# Patient Record
Sex: Male | Born: 1965 | Race: Black or African American | Hispanic: No
Health system: Southern US, Community
[De-identification: ages and names within clinical notes are randomized; demographics above are authoritative.]

---

## 1997-08-06 ENCOUNTER — Emergency Department (HOSPITAL_COMMUNITY): Admission: EM | Admit: 1997-08-06 | Discharge: 1997-08-06 | Payer: Self-pay | Admitting: Emergency Medicine

## 2006-06-12 ENCOUNTER — Ambulatory Visit (HOSPITAL_COMMUNITY): Admission: RE | Admit: 2006-06-12 | Discharge: 2006-06-12 | Payer: Self-pay | Admitting: Cardiology

## 2009-01-02 ENCOUNTER — Encounter: Admission: RE | Admit: 2009-01-02 | Discharge: 2009-01-02 | Payer: Self-pay | Admitting: Cardiology

## 2010-08-02 ENCOUNTER — Emergency Department (HOSPITAL_COMMUNITY)
Admission: EM | Admit: 2010-08-02 | Discharge: 2010-08-02 | Disposition: A | Discharge status: Home / Self Care | Payer: Self-pay | Attending: Emergency Medicine | Admitting: Emergency Medicine

## 2010-08-02 ENCOUNTER — Emergency Department (HOSPITAL_COMMUNITY): Payer: Self-pay

## 2010-08-02 DIAGNOSIS — R55 Syncope and collapse: Secondary | ICD-10-CM | POA: Insufficient documentation

## 2010-08-02 DIAGNOSIS — E860 Dehydration: Secondary | ICD-10-CM | POA: Insufficient documentation

## 2010-08-02 DIAGNOSIS — R42 Dizziness and giddiness: Secondary | ICD-10-CM | POA: Insufficient documentation

## 2010-08-02 DIAGNOSIS — R232 Flushing: Secondary | ICD-10-CM | POA: Insufficient documentation

## 2010-08-02 LAB — POCT I-STAT, CHEM 8
BUN: 22 mg/dL (ref 6–23)
Chloride: 106 mEq/L (ref 96–112)
Glucose, Bld: 87 mg/dL (ref 70–99)
HCT: 43 % (ref 39.0–52.0)
Hemoglobin: 14.6 g/dL (ref 13.0–17.0)
TCO2: 24 mmol/L (ref 0–100)

## 2011-09-27 IMAGING — CR DG CHEST 2V
2 series · 2 of 2 positions shown · non-contrast
Comparison: 01/02/2009

CLINICAL DATA: Syncope

CHEST - 2 VIEW

[w chest pa]
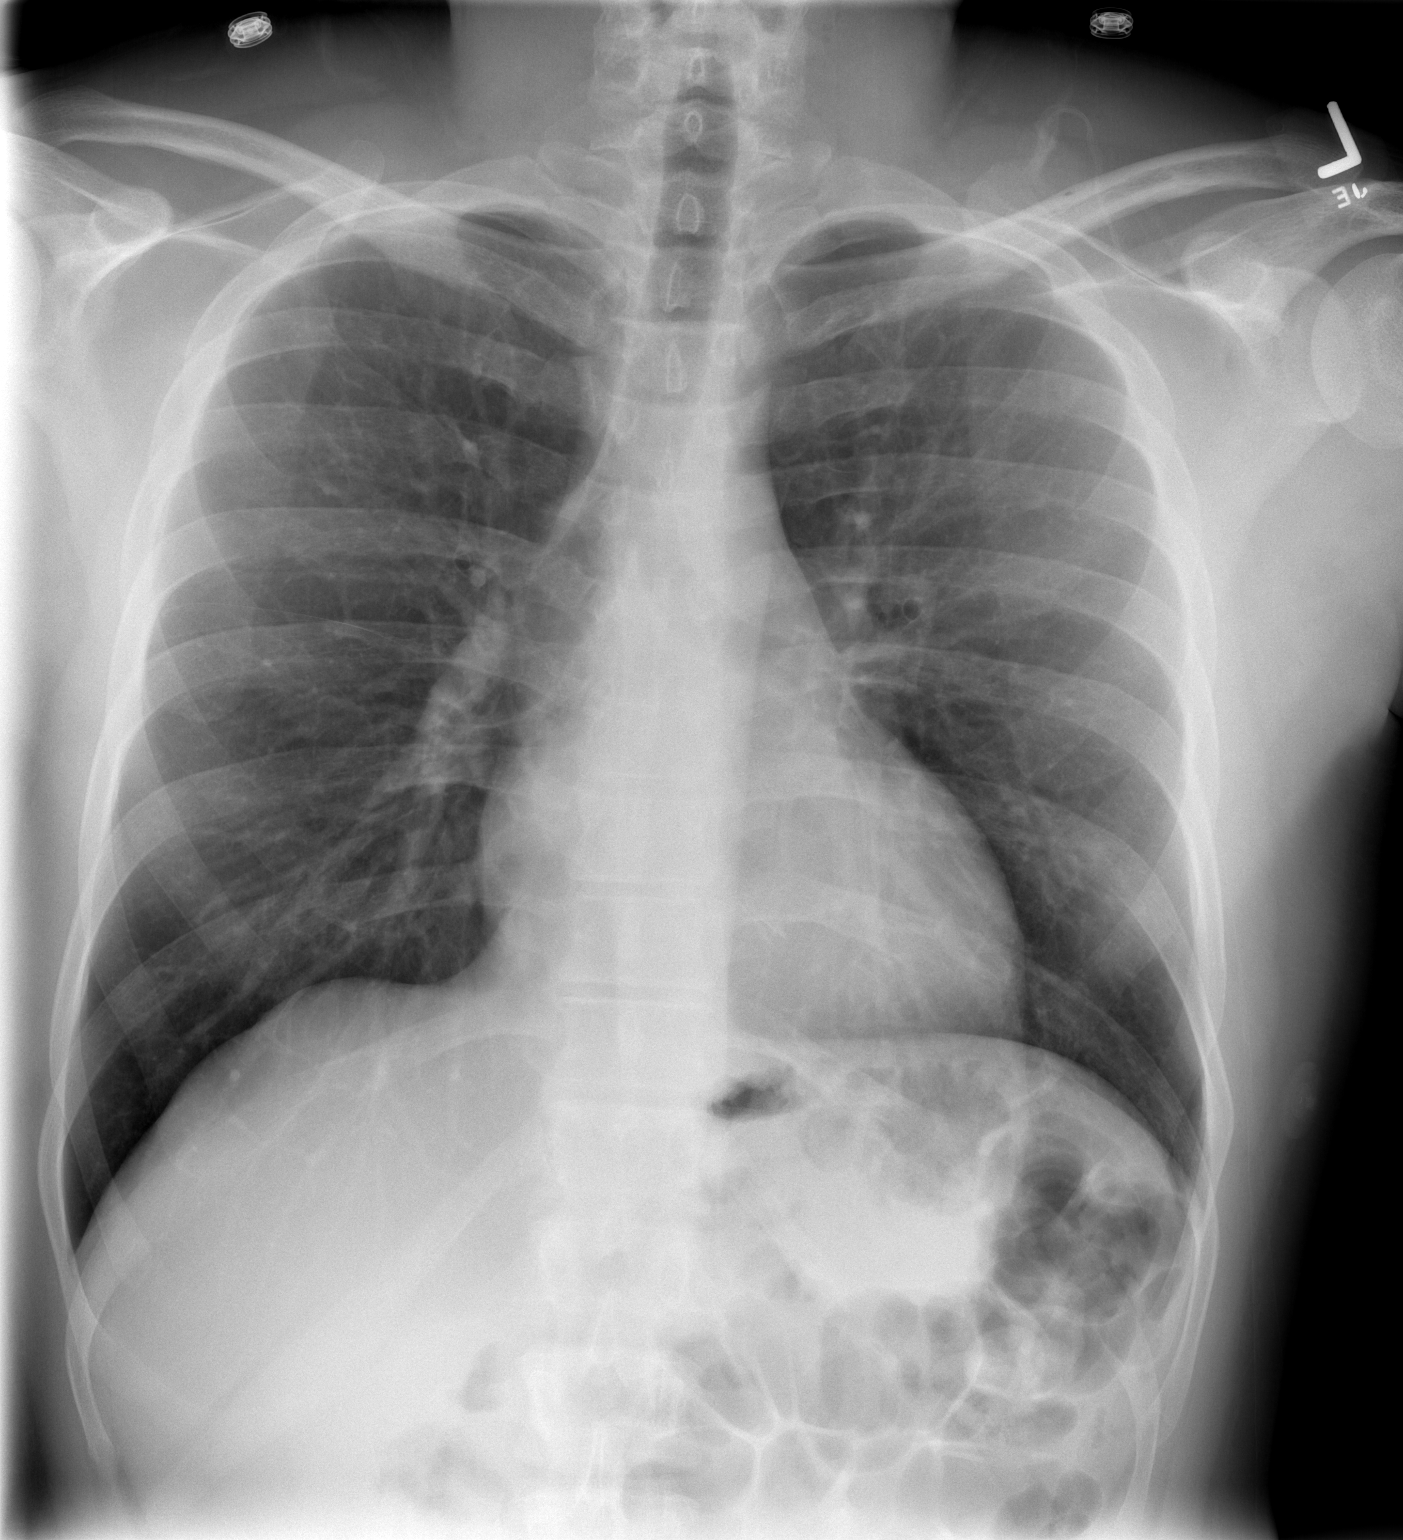

[w chest lat]
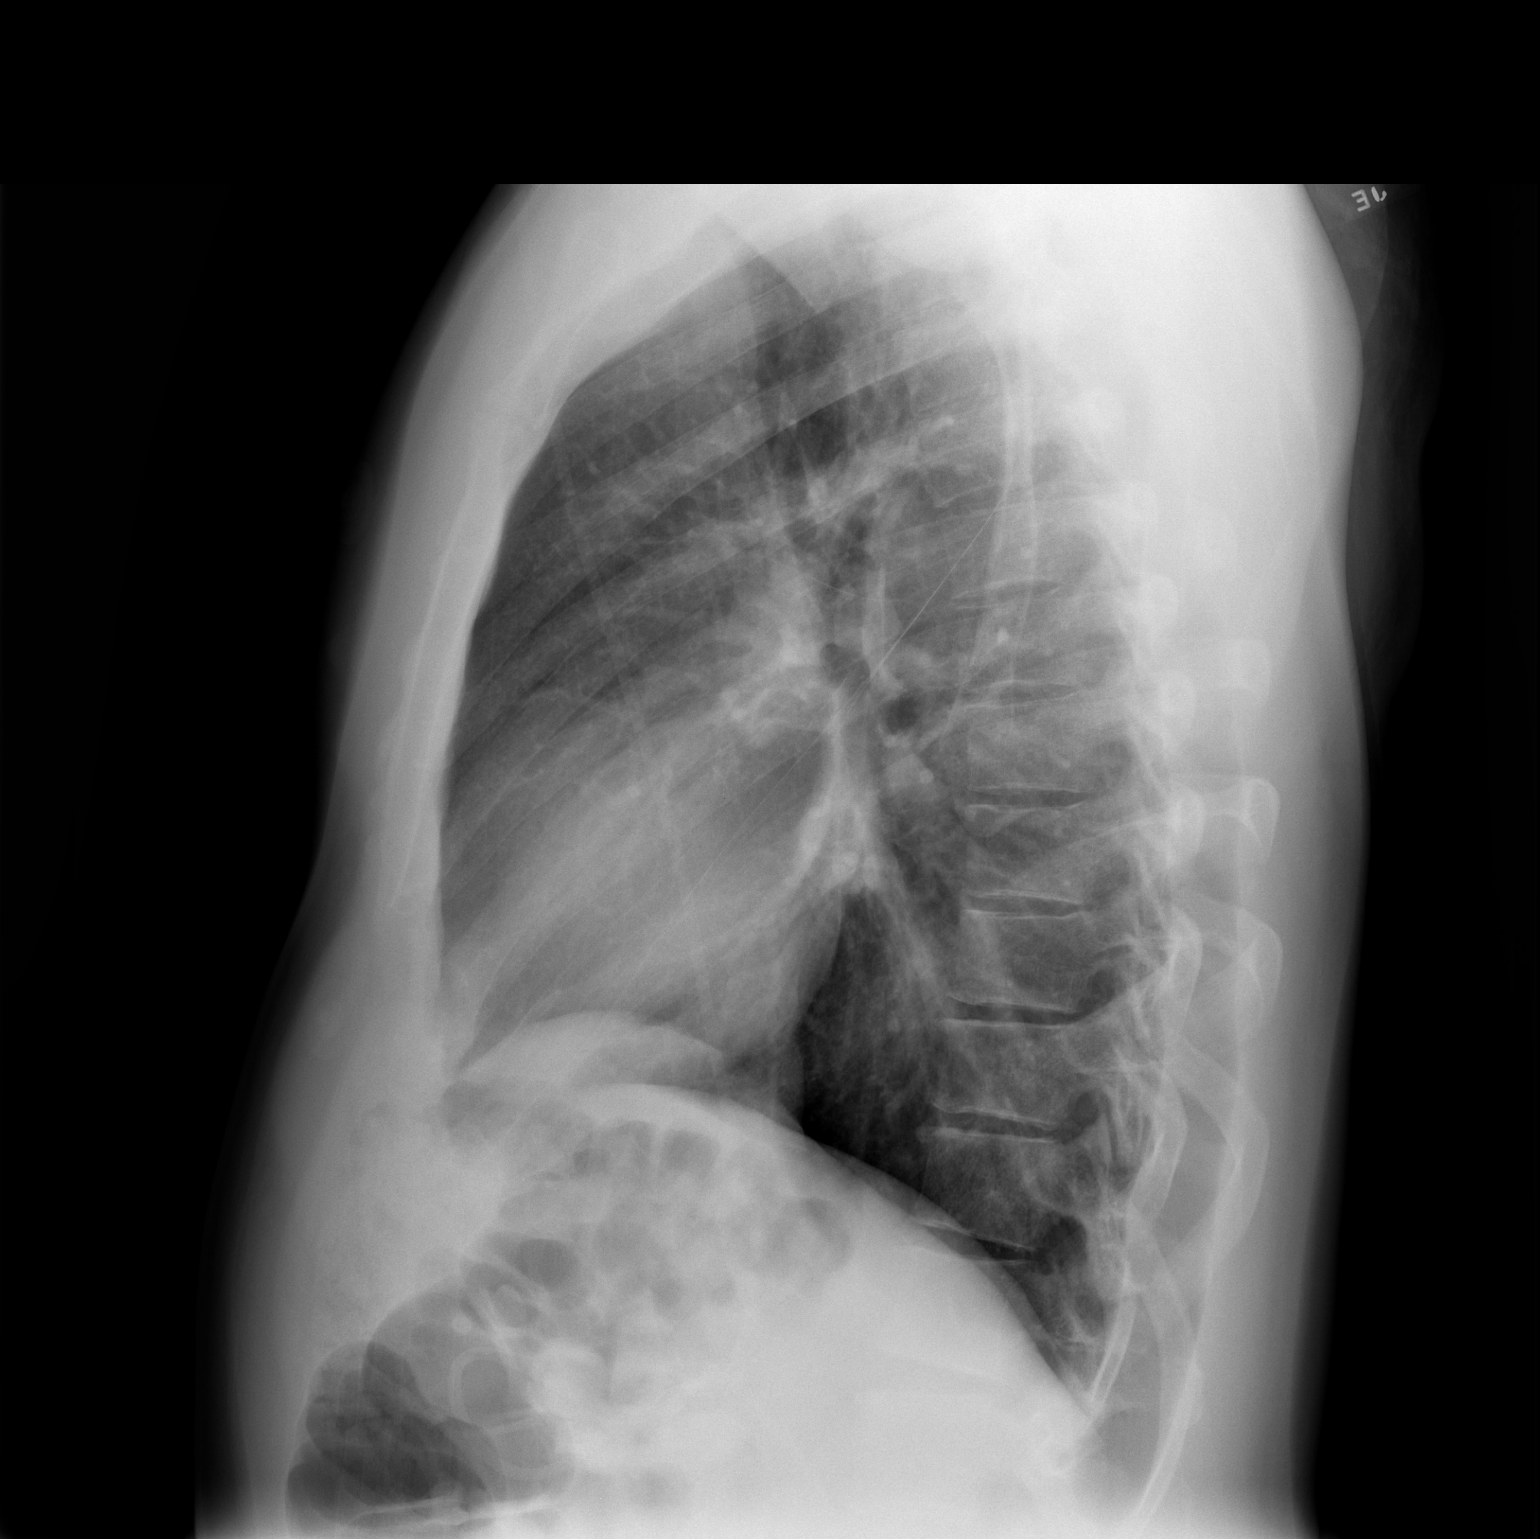

[2 of 2 positions shown; findings below may reference images not displayed]

FINDINGS: The lungs are clear without focal infiltrate, edema,
pneumothorax or pleural effusion. The cardiopericardial silhouette
is within normal limits for size. Imaged bony structures of the
thorax are intact.
IMPRESSION: No acute cardiopulmonary process.

## 2018-12-13 ENCOUNTER — Other Ambulatory Visit: Payer: Self-pay

## 2018-12-13 ENCOUNTER — Other Ambulatory Visit: Payer: Self-pay | Admitting: *Deleted

## 2018-12-13 DIAGNOSIS — Z125 Encounter for screening for malignant neoplasm of prostate: Secondary | ICD-10-CM

## 2018-12-13 NOTE — Progress Notes (Addendum)
Patient: Edward [name]           Date of Birth: 1965/12/03           MRN: [id number] Visit Date: 12/13/2018 PCP: No primary care provider on file.  Prostate Cancer Screening Date of last physical exam: (virtual in Feb/March 2020) Date of last rectal exam: (not sure) Have you ever had or been told you have an allergy to latex products?: No Are you currently taking any natural prostate preparations?: No Are you currently experiencing any urinary symptoms?: No  Temperature 97.1   Prostate Exam Exam not completed.  Patient's History There are no active problems to display for this patient.  No past medical history on file.  No family history on file.  Social History   Occupational History  . Not on file  Tobacco Use  . Smoking status: Not on file  Substance and Sexual Activity  . Alcohol use: Not on file  . Drug use: Not on file  . Sexual activity: Not on file

## 2018-12-14 LAB — PSA: Prostate Specific Ag, Serum: 1.8 ng/mL (ref 0.0–4.0)

## 2020-10-15 ENCOUNTER — Other Ambulatory Visit: Payer: Self-pay

## 2020-10-15 ENCOUNTER — Encounter: Payer: 59 | Attending: Internal Medicine | Admitting: Dietician

## 2020-10-15 DIAGNOSIS — R7303 Prediabetes: Secondary | ICD-10-CM | POA: Insufficient documentation

## 2020-10-15 NOTE — Progress Notes (Signed)
On 10/15/2020 patient completed Core Session 1 of Diabetes Prevention Program course virtually with [facility]. The following learning objectives were met by the patient during this class:   Virtual Visit via Video Note  I connected with Edward [name]. [place], August 29, 1965 by a video enabled application and verified that I am speaking with the correct person using two identifiers.  Location: Patient: Virtual Provider: Office    Learning Objectives:  Be able to explain the purpose and benefits of the National Diabetes Prevention Program.  Be able to describe the events that will take place at every session.  Know the weight loss and physical activity goals established by the [facility] Diabetes Prevention Program.  Know their own individual weight loss and physical activity goals.  Be able to explain the important effect of self-monitoring on behavior change.   Goals:  Record food and beverage intake in "Food and Activity Tracker" over the next week.  E-mail completed "Food and Activity Tracker" to Lifestyle Coach next week before session 2. Circle the foods or beverages you think are highest in fat and calories in your food tracker. Read the labels on the food you buy, and consider using measuring cups and spoons to help you calculate the amount you eat. We will talk about measuring in more detail in the coming weeks.   Follow-Up Plan: Attend Core Session 2 next week.  E-mail completed "Food and Activity Tracker" to Lifestyle Coach next week before class.

## 2020-10-22 ENCOUNTER — Other Ambulatory Visit: Payer: Self-pay

## 2020-10-22 ENCOUNTER — Encounter: Payer: 59 | Attending: Internal Medicine | Admitting: Dietician

## 2020-10-22 DIAGNOSIS — R7303 Prediabetes: Secondary | ICD-10-CM | POA: Insufficient documentation

## 2020-10-22 NOTE — Progress Notes (Signed)
On 10/22/2020 patient completed Core Session 2 of Diabetes Prevention Program course virtually with [facility]. The following learning objectives were met by the patient during this class:   Virtual Visit via Video Note  I connected with Edward [name], 11/09/1965 on 10/22/20 at  9:00 AM EDT by a video enabled application and verified that I am speaking with the correct person using two identifiers.  Location: Patient: Virtual Provider: Office  Learning Objectives: Self-monitor their weight during the weeks following Session 2.  Describe the relationship between fat and calories.  Explain the reason for, and basic principles of, self-monitoring fat grams and calories.  Identify their personal fat gram goals.  Use the ?Fat and Calorie Counter to calculate the calories and fat grams of a given selection of foods.  Keep a running total of the fat grams they eat each day.  Calculate fat, calories, and serving sizes from nutrition labels.   Goals:  Weigh yourself at the same time each day, or every few days, and record your weight in your Food and Activity Tracker. Write down everything you eat and drink in your Food and Activity Tracker. Measure portions as much as you can, and start reading labels.  Use the ?Fat and Calorie Counter to figure out the amount of fat and calories in what you ate, and write the amount down in your Food and Activity Tracker. Keep a running fat gram total throughout the day. Come as close to your fat gram goal as you can.   Follow-Up Plan: Attend Core Session 3 next week.  Email completed  "Food and Activity Tracker" to Lifestyle Coach next week.   

## 2020-10-29 ENCOUNTER — Other Ambulatory Visit: Payer: Self-pay

## 2020-10-29 ENCOUNTER — Encounter: Payer: 59 | Admitting: Dietician

## 2020-10-29 DIAGNOSIS — R7303 Prediabetes: Secondary | ICD-10-CM

## 2020-10-29 NOTE — Progress Notes (Signed)
On 10/29/2020 patient completed Core Session 3 of Diabetes Prevention Program course virtually with [facility]. The following learning objectives were met by the patient during this class:   Virtual Visit via Video Note  I connected with Meriam Sprague. [place], September 07, 1965 on 10/29/20 at  9:00 AM EDT by a video enabled application and verified that I am speaking with the correct person using two identifiers.  Location: Patient: Virtual Provider: Office  Learning Objectives: Weigh and measure foods. Estimate the fat and calorie content of common foods. Describe three ways to eat less fat and fewer calories. Create a plan to eat less fat for the following week.   Goals:  Track weight when weighing outside of class.  Track food and beverages eaten each day in Food and Activity Tracker and include fat grams and calories for each.  Try to stay within fat gram goal.  Complete plan for eating less high fat foods and answer related homework questions.    Follow-Up Plan: Attend Core Session 4 next week.  Bring completed "Food and Activity Tracker" next week to be reviewed by Lifestyle Coach.

## 2020-11-12 ENCOUNTER — Ambulatory Visit: Payer: 59 | Admitting: Registered"

## 2020-11-12 ENCOUNTER — Encounter: Payer: Self-pay | Admitting: Registered"

## 2020-11-12 DIAGNOSIS — R7303 Prediabetes: Secondary | ICD-10-CM

## 2020-11-12 NOTE — Progress Notes (Signed)
On 11/12/20 patient completed Core Session 5 of Diabetes Prevention Program course virtually with [facility]. The following learning objectives were met by the patient during this class:   Virtual Visit via Video Note  I connected with Edward [name] on 11/12/20 at  9:00 AM EDT by a video enabled application and verified that I am speaking with the correct person using two identifiers.  Location: Patient: Home.  Provider: Office.   Learning Objectives: Establish a physical activity goal. Explain the importance of the physical activity goal. Describe their current level of physical activity. Name ways that they are already physically active. Develop personal plans for physical activity for the next week.   Goals:  Record weight taken outside of class.  Track foods and beverages eaten each day in the "Food and Activity Tracker," including calories and fat grams for each item.  Make an Activity Plan including date, specific type of activity, and length of time you plan to be active that includes at last 60 minutes of activity for the week.  Track activity type, minutes you were active, and distance you reached each day in the "Food and Activity Tracker."   Follow-Up Plan: Attend Core Session 6 next week.  E-mail completed "Food and Activity Tracker" to Lifestyle Coach next week before class

## 2020-11-19 ENCOUNTER — Encounter: Payer: 59 | Admitting: Dietician

## 2020-11-19 ENCOUNTER — Other Ambulatory Visit: Payer: Self-pay

## 2020-11-19 DIAGNOSIS — R7303 Prediabetes: Secondary | ICD-10-CM

## 2020-11-19 NOTE — Progress Notes (Signed)
On 11/19/2020 patient completed Core Session 6 of Diabetes Prevention Program course virtually with [facility]. The following learning objectives were met by the patient during this class:   Virtual Visit via Video Note  I connected with Edward [name]. Edward [name], 10/21/1965 on 11/19/20 at  9:00 AM EDT by a video enabled application and verified that I am speaking with the correct person using two identifiers.  Location: Patient: Virtual Provider: Office  Learning Objectives: Graph their daily physical activity.  Describe two ways of finding the time to be active.  Define "lifestyle activity."  Describe how to prevent injury.  Develop an activity plan for the coming week.   Goals:  Record weight taken outside of class.  Track foods and beverages eaten each day in the "Food and Activity Tracker," including calories and fat grams for each item.   Track activity type, minutes you were active, and distance you reached each day in the "Food and Activity Tracker."  Set aside one 20 to 30-minute block of time every day or find two or more periods of 10 to15 minutes each for physical activity.  Warm up, cool down, and stretch. Make a Physical Activities Plan for the Week.   Follow-Up Plan: Attend Core Session 7 next week.  E-mail completed "Food and Activity Tracker" to Lifestyle Coach next week before class

## 2020-12-03 ENCOUNTER — Encounter: Payer: 59 | Attending: Internal Medicine | Admitting: Dietician

## 2020-12-03 ENCOUNTER — Other Ambulatory Visit: Payer: Self-pay

## 2020-12-03 DIAGNOSIS — R7303 Prediabetes: Secondary | ICD-10-CM | POA: Insufficient documentation

## 2020-12-03 NOTE — Progress Notes (Signed)
On 12/03/2020 patient completed Core Session 7 of Diabetes Prevention Program course virtually with [facility]. The following learning objectives were met by the patient during this class:   Virtual Visit via Video Note  I connected with Edward [name]. [name], 04-27-1965 on 12/03/20 at  9:00 AM EDT by a video enabled application and verified that I am speaking with the correct person using two identifiers.  Location: Patient: Virtual Provider: Office  Learning Objectives: Define calorie balance. Explain how healthy eating and being active are related in terms of calorie balance.  Describe the relationship between calorie balance and weight loss.  Describe his or her progress as it relates to calorie balance.  Develop an activity plan for the coming week.   Goals:  Record weight taken outside of class.  Track foods and beverages eaten each day in the "Food and Activity Tracker," including calories and fat grams for each item.   Track activity type, minutes you were active, and distance you reached each day in the "Food and Activity Tracker."  Set aside one 20 to 30-minute block of time every day or find two or more periods of 10 to15 minutes each for physical activity.  Make a Physical Activities Plan for the Week.  Make active lifestyle choices all through the day  Stay at or go slightly over activity goal.   Follow-Up Plan: Attend Core Session 8 next week.  E-mail completed "Food and Activity Tracker" to Lifestyle Coach next week before class

## 2020-12-10 ENCOUNTER — Encounter: Payer: 59 | Admitting: Dietician

## 2020-12-10 ENCOUNTER — Other Ambulatory Visit: Payer: Self-pay

## 2020-12-10 DIAGNOSIS — R7303 Prediabetes: Secondary | ICD-10-CM

## 2020-12-10 NOTE — Progress Notes (Signed)
On 12/10/2020 patient completed Core Session 8 of Diabetes Prevention Program course virtually with [facility]. The following learning objectives were met by the patient during this class:   Virtual Visit via Video Note  I connected with Edward [name], 19-Sep-1965 by a video enabled application and verified that I am speaking with the correct person using two identifiers.  Location: Patient: Virtual Provider: Office  Learning Objectives: Recognize positive and negative food and activity cues.  Change negative food and activity cues to positive cues.  Add positive cues for activity and eliminate cues for inactivity.  Develop a plan for removing one problem food cue for the coming week.   Goals:  Record weight taken outside of class.  Track foods and beverages eaten each day in the "Food and Activity Tracker," including calories and fat grams for each item.   Track activity type, minutes you were active, and distance you reached each day in the "Food and Activity Tracker."  Set aside one 20 to 30-minute block of time every day or find two or more periods of 10 to15 minutes each for physical activity.  Remove one problem food cue.  Add one positive cue for being more active.  Follow-Up Plan: Attend Core Session 9 next week.  Email completed "Food and Activity Tracker" next week to be reviewed by Lifestyle Coach.

## 2020-12-24 ENCOUNTER — Encounter: Payer: 59 | Attending: Internal Medicine

## 2020-12-24 DIAGNOSIS — R7303 Prediabetes: Secondary | ICD-10-CM | POA: Insufficient documentation

## 2021-01-07 ENCOUNTER — Encounter: Payer: 59 | Admitting: Dietician

## 2021-01-07 ENCOUNTER — Other Ambulatory Visit: Payer: Self-pay

## 2021-01-07 DIAGNOSIS — R7303 Prediabetes: Secondary | ICD-10-CM

## 2021-01-07 NOTE — Progress Notes (Signed)
On 01/07/2021 patient completed Session 11 of Diabetes Prevention Program course virtually with [facility]. By the end of this session patients are able to complete the following objectives:   Virtual Visit via Video Note  I connected with Edward [name]. [place][birth year] by a video enabled application and verified that I am speaking with the correct person using two identifiers.  Location: Patient: Virtual Provider: Office  Learning Objectives: Give examples of negative thoughts that could prevent them from meeting their goals of losing weight and being more physically active.  Describe how to stop negative thoughts and talk back to them with positive thoughts.  Practice 1) stopping negative thoughts and 2) talking back to negative thoughts with positive ones.    Goals:  Record weight taken outside of class.  Track foods and beverages eaten each day in the "Food and Activity Tracker," including calories and fat grams for each item.   Track activity type, minutes you were active, and distance you reached each day in the "Food and Activity Tracker."  If you have any negative thoughts-write them in your Food and Activity Trackers, along with how you talked back to them. Practice stopping negative thoughts and talking back to them with positive thoughts.   Follow-Up Plan: Attend Core Session 12 next week.  Email completed "Food and Activity Tracker" before next week to be reviewed by Lifestyle Coach.

## 2021-01-21 ENCOUNTER — Other Ambulatory Visit: Payer: Self-pay

## 2021-01-21 ENCOUNTER — Encounter: Payer: 59 | Admitting: Dietician

## 2021-01-21 DIAGNOSIS — R7303 Prediabetes: Secondary | ICD-10-CM

## 2021-01-21 NOTE — Progress Notes (Signed)
Patient was seen on 01/21/2021 for the Core Session 12 of Diabetes Prevention Program course at [facility]. By the end of this session patients are able to complete the following objectives:   Virtual Visit via Video Note  I connected with Edward [name], Aug 01, 1965 on 01/21/21 at  9:00 AM EDT by a video enabled application and verified that I am speaking with the correct person using two identifiers.  Location: Patient: Virtual Provider: Office  Learning Objectives: Describe their current progress toward defined goals. Describe common causes for slipping from healthy eating or being active. Explain what to do to get back on their feet after a slip.  Goals:  Record weight taken outside of class.  Track foods and beverages eaten each day in the "Food and Activity Tracker," including calories and fat grams for each item.   Track activity type, minutes active, and distance reached each day in the "Food and Activity Tracker."  Try out the two action plans created during session- "Slips from Healthy Eating: Action Plan" and "Slips from Being Active: Action Plan" Answer questions on the handout.   Follow-Up Plan: Attend Core Session 13 next week.  Bring completed "Food and Activity Tracker" next week to be reviewed by Lifestyle Coach.

## 2021-01-28 ENCOUNTER — Encounter: Payer: 59 | Attending: Internal Medicine | Admitting: Dietician

## 2021-01-28 ENCOUNTER — Other Ambulatory Visit: Payer: Self-pay

## 2021-01-28 DIAGNOSIS — R7303 Prediabetes: Secondary | ICD-10-CM | POA: Insufficient documentation

## 2021-01-28 NOTE — Progress Notes (Signed)
On 01/28/2021 patient completed the Core Session 13 of Diabetes Prevention Program course virtually with [facility]. By the end of this session patients are able to complete the following objectives:   Virtual Visit via Video Note  I connected with Edward [name]. [place], [birth year] on 01/28/21 at  9:00 AM EST by a video enabled application and verified that I am speaking with the correct person using two identifiers.  Location: Patient: Virtual Provider: Office  Learning Objectives: Describe ways to add interest and variety to their activity plans. Define ?aerobic fitness. Explain the four F.I.T.T. principles (frequency, intensity, time, and type of activity) and how they relate to aerobic fitness.   Goals:  Record weight taken outside of class.  Track foods and beverages eaten each day in the "Food and Activity Tracker," including calories and fat grams for each item.   Track activity type, minutes you were active, and distance you reached each day in the "Food and Activity Tracker."  Do your best to reach activity goal for the week. Use one of the F.I.T.T. principles to jump start workouts. Document activity level on the "To Do Next Week" handout.  Follow-Up Plan: Attend Core Session 14 next week.  Email completed "Food and Activity Tracker" before next week to be reviewed by Lifestyle Coach.

## 2021-02-11 ENCOUNTER — Encounter: Payer: 59 | Admitting: Dietician

## 2021-02-11 ENCOUNTER — Other Ambulatory Visit: Payer: Self-pay

## 2021-02-11 DIAGNOSIS — R7303 Prediabetes: Secondary | ICD-10-CM

## 2021-02-11 NOTE — Progress Notes (Signed)
On 02/11/2021 patient completed Core Session 14 of Diabetes Prevention Program course virtually with [facility]. By the end of this session patients are able to complete the following objectives:   Virtual Visit via Video Note  I connected with Edward Beaver. [name], February 23, 1966 on 02/11/21 at  9:00 AM EST by a video enabled application and verified that I am speaking with the correct person using two identifiers.  Location: Patient: Virtual Provider: Office  Learning Objectives: Give examples of problem social cues and helpful social cues.  Explain how to remove problem social cues and add helpful ones.  Describe ways of coping with vacations and social events such as parties, holidays, and visits from relatives and friends.  Create an action plan to change a problem social cue and add a helpful one.   Goals:  Record weight taken outside of class.  Track foods and beverages eaten each day in the "Food and Activity Tracker," including calories and fat grams for each item.   Track activity type, minutes you were active, and distance you reached each day in the "Food and Activity Tracker."  Do your best to reach activity goal for the week. Use action plan created during session to change a problem social cue and add a helpful social cue.  Answer questions regarding success of changing social cues on "To Do Next Week" handout.   Follow-Up Plan: Attend Core Session 15 next week.  Email completed "Food and Activity Tracker" before next week to be reviewed by Lifestyle Coach.

## 2021-02-18 ENCOUNTER — Encounter: Payer: 59 | Admitting: Dietician

## 2021-02-18 ENCOUNTER — Other Ambulatory Visit: Payer: Self-pay

## 2021-02-18 DIAGNOSIS — R7303 Prediabetes: Secondary | ICD-10-CM

## 2021-02-18 NOTE — Progress Notes (Signed)
On 02/18/2021 patient completed Core Session 15 of Diabetes Prevention Program course virtually with [facility]. By the end of this session patients are able to complete the following objectives:   Virtual Visit via Video Note  I connected with Edward [name]. [place], 12-30-1965 on 02/18/21 at  9:00 AM EST by a video enabled application and verified that I am speaking with the correct person using two identifiers.  Location: Patient: Virtual Provider: Office  Learning Objectives: Explain how to prevent stress or cope with unavoidable stress.  Describe how this program can be a source of stress.  Explain how to manage stressful situations.  Create and follow an action plan for either preventing or coping with a stressful situation.   Goals:  Record weight taken outside of class.  Track foods and beverages eaten each day in the "Food and Activity Tracker," including calories and fat grams for each item.   Track activity type, minutes you were active, and distance you reached each day in the "Food and Activity Tracker."  Do your best to reach activity goal for the week. Follow your action plan to reduce stress.  Answer questions on handout regarding success of action plan.   Follow-Up Plan: Attend Core Session 16 next week.  Email completed "Food and Activity Tracker" before next week to be reviewed by Lifestyle Coach.

## 2021-03-04 ENCOUNTER — Other Ambulatory Visit: Payer: Self-pay

## 2021-03-04 ENCOUNTER — Encounter: Payer: 59 | Attending: Internal Medicine | Admitting: Dietician

## 2021-03-04 DIAGNOSIS — R7303 Prediabetes: Secondary | ICD-10-CM | POA: Insufficient documentation

## 2021-03-04 NOTE — Progress Notes (Signed)
On 03/04/2021 patient completed a post core session of the Diabetes Prevention Program course virtually with [facility]. By the end of this session patients are able to complete the following objectives:   Virtual Visit via Video Note  I connected with Edward [name], 03/15/1966 by a video enabled application and verified that I am speaking with the correct person using two identifiers.  Location: Patient: Virtual Provider: Office  Learning Objectives: Identify how to maintain and/or continue working toward program goals for the remainder of the program.  Describe ways that food and activity tracking can assist them in maintaining/reaching program goals.  Identify progress they have made since the beginning of the program.   Goals:  Record weight taken outside of class.  Track foods and beverages eaten each day in the "Food and Activity Tracker," including calories and fat grams for each item.   Track activity type, minutes you were active, and distance you reached each day in the "Food and Activity Tracker."   Follow-Up Plan: Attend session 18 in two weeks.  Email completed "Food and Activity Trackers" before next session to be reviewed by Lifestyle Coach.

## 2021-04-01 ENCOUNTER — Encounter: Payer: Self-pay | Attending: Internal Medicine

## 2021-04-01 DIAGNOSIS — R7303 Prediabetes: Secondary | ICD-10-CM | POA: Insufficient documentation

## 2021-04-15 ENCOUNTER — Encounter: Payer: Self-pay | Admitting: Dietician

## 2021-04-15 ENCOUNTER — Other Ambulatory Visit: Payer: Self-pay

## 2021-04-15 DIAGNOSIS — R7303 Prediabetes: Secondary | ICD-10-CM

## 2021-04-15 NOTE — Progress Notes (Signed)
On 04/15/2021 patient completed a post core session of the Diabetes Prevention Program course virtually with [facility]. By the end of this session patients are able to complete the following objectives:   Virtual Visit via Video Note  I connected with Edward [name]. [place], 23-May-1965 by a video enabled application and verified that I am speaking with the correct person using two identifiers.  Location: Patient: Virtual Provider: Office  Learning Objectives: Describe how to incorporate more fruits and vegetables into meals. List criteria for selecting good quality fruits and vegetables at the store.  Define mindful eating. List the benefits of eating mindfully.   Goals:  Record weight taken outside of class.  Track foods and beverages eaten each day in the "Food and Activity Tracker," including calories and fat grams for each item.   Track activity type, minutes you were active, and distance you reached each day in the "Food and Activity Tracker."   Follow-Up Plan: Attend next session.  Email completed "Food and Activity Trackers" before next session to be reviewed by Lifestyle Coach.

## 2021-04-29 ENCOUNTER — Encounter: Payer: Self-pay | Attending: Internal Medicine | Admitting: Dietician

## 2021-04-29 ENCOUNTER — Other Ambulatory Visit: Payer: Self-pay

## 2021-04-29 DIAGNOSIS — R7303 Prediabetes: Secondary | ICD-10-CM | POA: Insufficient documentation

## 2021-04-29 NOTE — Progress Notes (Signed)
On 04/29/2021 patient completed the Diabetes Prevention Program course virtually with [facility]. By the end of this session patients are able to complete the following objectives:   Virtual Visit via Video Note  I connected with Edward [name], 10-07-1965 by a video enabled application and verified that I am speaking with the correct person using two identifiers.  Location: Patient: Virtual Provider: Office  Learning Objectives: Define fiber and describe the difference between insoluble and soluble fiber  List foods that are good sources of fiber Explain the health benefits of fiber  Describe ways to increase volume of meals and snacks while staying within fat goal.   Goals:  Record weight taken outside of class.  Track foods and beverages eaten each day in the "Food and Activity Tracker," including calories and fat grams for each item.   Track activity type, minutes you were active, and distance you reached each day in the "Food and Activity Tracker."   Follow-Up Plan: Attend next session.  Email completed "Food and Activity Trackers" before next session to be reviewed by Lifestyle Coach.

## 2021-05-13 ENCOUNTER — Other Ambulatory Visit: Payer: Self-pay

## 2021-05-13 ENCOUNTER — Encounter: Payer: Self-pay | Admitting: Dietician

## 2021-05-13 DIAGNOSIS — R7303 Prediabetes: Secondary | ICD-10-CM

## 2021-05-13 NOTE — Progress Notes (Signed)
On 05/13/2021 patient completed a post core session of the Diabetes Prevention Program course virtually with [facility]. By the end of this session patients are able to complete the following objectives:   Virtual Visit via Video Note  I connected with Edward [name]. Edward [name], 1965/07/08 by a video enabled application and verified that I am speaking with the correct person using two identifiers.  Location: Patient: Virtual Provider: Office  Learning Objectives: List ways to make recipes healthier.  List lower-fat and lower-calorie substitutions for common ingredients.  Identify low-fat cooking methods.  Describe how to choose a cookbook that works best for their needs.   Goals:  Record weight taken outside of class.  Track foods and beverages eaten each day in the "Food and Activity Tracker," including calories and fat grams for each item.   Track activity type, minutes you were active, and distance you reached each day in the "Food and Activity Tracker."   Follow-Up Plan: Attend next session.  Email completed "Food and Activity Trackers" before next session to be reviewed by Lifestyle Coach.

## 2021-06-10 ENCOUNTER — Other Ambulatory Visit: Payer: Self-pay

## 2021-06-10 ENCOUNTER — Encounter: Payer: Self-pay | Attending: Internal Medicine | Admitting: Dietician

## 2021-06-10 DIAGNOSIS — R7303 Prediabetes: Secondary | ICD-10-CM | POA: Insufficient documentation

## 2021-06-10 NOTE — Progress Notes (Signed)
On 06/10/2021 patient attended a virtual grocery store tour session as part of the Diabetes Prevention Program with [facility]. ? ?Virtual Visit via Video Note ? ?I connected with Bayard Beaver. [place], 07/06/1965 by a video enabled application and verified that I am speaking with the correct person using two identifiers. ? ?Location: ?Patient: Virtual ?Provider: Office ? ? Learning Objectives: ?Develop a plan for our grocery shopping experience ?Putting together a list ?How to navigate the grocery store ?Identify 4 main sections of the grocery store ?Produce ?Meat/Poultry/Fish ?Dairy  ?Inside Aisles ?Consider tips for shopping in each of these four sections ?Reflect on our own shopping habits ?Create a new goal for our next grocery shopping experience ?Engage in a group discussion ? ?Goals:  ?Record weight taken outside of class.  ?Track foods and beverages eaten each day in the "Food and Activity Tracker," including calories and fat grams for each item.  ?Create one new goal for the next grocery shopping experience based on the information provided today ? ?Follow-Up Plan: ?Attend next session.  ?Email completed "Food and Activity Tracker" before next session to be reviewed by Lifestyle Coach.  ? ?

## 2021-07-08 ENCOUNTER — Encounter: Payer: Self-pay | Attending: Internal Medicine | Admitting: Dietician

## 2021-07-08 DIAGNOSIS — R7303 Prediabetes: Secondary | ICD-10-CM | POA: Insufficient documentation

## 2021-07-08 NOTE — Progress Notes (Signed)
On 07/08/2021 patient completed a post core session of the Diabetes Prevention Program virtually with [facility]. By the end of this session patients are able to complete the following objectives:  ? ?Virtual Visit via Video Note ? ?I connected with Edward [name]. Edward [name], Edward [name] by a video enabled application and verified that I am speaking with the correct person using two identifiers. ? ?Location: ?Patient: Virtual ?Provider: Office ? ?Learning Objectives: ?List indoor physical activity options.  ?Identify any barriers to being active and brainstorm how to overcome barriers.  ?Describe short and long-term health benefits of physical activity.  ? ?Goals:  ?Record weight taken outside of class.  ?Track foods and beverages eaten each day in the "Food and Activity Tracker," including calories and fat grams for each item.   ?Track activity type, minutes you were active, and distance you reached each day in the "Food and Activity Tracker."  ? ?Follow-Up Plan: ?Attend next session.  ?Email completed "Food and Activity Trackers" before next session to be reviewed by Lifestyle Coach. ? ?

## 2021-08-05 ENCOUNTER — Encounter: Payer: Self-pay | Admitting: Dietician

## 2021-08-05 ENCOUNTER — Encounter: Payer: Self-pay | Attending: Internal Medicine | Admitting: Dietician

## 2021-08-05 DIAGNOSIS — R7303 Prediabetes: Secondary | ICD-10-CM | POA: Insufficient documentation

## 2021-08-05 NOTE — Progress Notes (Signed)
On 08/05/2021 patient completed a post core session of the Diabetes Prevention Program course virtually with [facility]. By the end of this session patients are able to complete the following objectives:  ? ?Virtual Visit via Video Note ? ?I connected with Bayard Beaver. Gaut by a video enabled application and verified that I am speaking with the correct person using two identifiers. ?  ?I discussed the limitations of evaluation and management by telemedicine and the availability of in person appointments. The patient expressed understanding and agreed to proceed. ? ?Location: ?Patient: Edward [name] (virtual) ?Provider: office ? ?Learning Objectives: ?Identify which foods contain carbohydrates.  ?List functions for carbohydrates on the body.  ?Describe the relationship between carbohydrate intake and blood sugar.  ?Create balanced snack choices.  ? ?Goals:  ?Record weight taken outside of class.  ?Track foods and beverages eaten each day in the "Food and Activity Tracker," including calories and fat grams for each item.   ?Track activity type, minutes you were active, and distance you reached each day in the "Food and Activity Tracker."  ? ?Follow-Up Plan: ?Attend next session.  ?Email completed "Food and Activity Trackers" before next session to be reviewed by Lifestyle Coach.  ?

## 2021-09-09 ENCOUNTER — Encounter: Payer: Self-pay | Attending: Internal Medicine

## 2021-10-07 ENCOUNTER — Encounter: Payer: Self-pay | Attending: Internal Medicine | Admitting: Registered"

## 2021-10-07 DIAGNOSIS — R7303 Prediabetes: Secondary | ICD-10-CM | POA: Insufficient documentation

## 2021-10-10 ENCOUNTER — Encounter: Payer: Self-pay | Admitting: Registered"

## 2021-10-10 NOTE — Progress Notes (Signed)
On 10/07/21 pt completed a post core session of the Diabetes Prevention Program course virtually with [facility]. By the end of this session patients are able to complete the following objectives:   Virtual Visit via Video Note  I connected with Edward [name] by a video enabled application and verified that I am speaking with the correct person using two identifiers.  Location: Patient: Home.  Provider: Office.   Learning Objectives: Reflect on lifestyle changes they have made since starting the DPP.  Set long-term goals to promote continued maintenance of lifestyle changes made during the program.   Goals:  Work toward reaching new long-term goals set during class.   Follow-Up Plan: Contact Lifestyle Coach with questions/concerns PRN.

## 2022-11-07 ENCOUNTER — Other Ambulatory Visit (HOSPITAL_BASED_OUTPATIENT_CLINIC_OR_DEPARTMENT_OTHER): Payer: Self-pay

## 2022-11-07 DIAGNOSIS — G4733 Obstructive sleep apnea (adult) (pediatric): Secondary | ICD-10-CM

## 2023-05-05 ENCOUNTER — Ambulatory Visit
Admission: RE | Admit: 2023-05-05 | Discharge: 2023-05-05 | Disposition: A | Discharge status: Home / Self Care | Payer: No Typology Code available for payment source | Source: Ambulatory Visit | Attending: Internal Medicine | Admitting: Internal Medicine

## 2023-05-05 ENCOUNTER — Other Ambulatory Visit: Payer: Self-pay | Admitting: Internal Medicine

## 2023-05-05 DIAGNOSIS — M25561 Pain in right knee: Secondary | ICD-10-CM

## 2023-08-13 ENCOUNTER — Ambulatory Visit (INDEPENDENT_AMBULATORY_CARE_PROVIDER_SITE_OTHER): Admitting: Podiatry

## 2023-08-13 DIAGNOSIS — L600 Ingrowing nail: Secondary | ICD-10-CM

## 2023-08-13 NOTE — Patient Instructions (Signed)
 Ingrown Toenail  An ingrown toenail occurs when the corner or sides of a toenail grow into the surrounding skin. This causes discomfort and pain. The big toe is most commonly affected, but any of the toes can be affected. If an ingrown toenail is not treated, it can become infected. What are the causes? This condition may be caused by: Wearing shoes that are too small or tight. An injury, such as stubbing your toe or having your toe stepped on. Improper cutting or care of your toenails. Having nail or foot abnormalities that were present from birth (congenital abnormalities), such as having a nail that is too big for your toe. What increases the risk? The following factors may make you more likely to develop ingrown toenails: Age. Nails tend to get thicker with age, so ingrown nails are more common among older people. Cutting your toenails incorrectly, such as cutting them very short or cutting them unevenly. An ingrown toenail is more likely to get infected if you have: Diabetes. Blood flow (circulation) problems. What are the signs or symptoms? Symptoms of an ingrown toenail may include: Pain, soreness, or tenderness. Redness. Swelling. Hardening of the skin that surrounds the toenail. Signs that an ingrown toenail may be infected include: Fluid or pus. Symptoms that get worse. How is this diagnosed? Ingrown toenails may be diagnosed based on: Your symptoms and medical history. A physical exam. Labs or tests. If you have fluid or blood coming from your toenail, a sample may be collected to test for the specific type of bacteria that is causing the infection. How is this treated? Treatment depends on the severity of your symptoms. You may be able to care for your toenail at home. If you have an infection, you may be prescribed antibiotic medicines. If you have fluid or pus draining from your toenail, your health care provider may drain it. If you have trouble walking, you may be  given crutches to use. If you have a severe or infected ingrown toenail, you may need a procedure to remove part or all of the nail. Follow these instructions at home: Foot care  Check your wound every day for signs of infection, or as often as told by your health care provider. Check for: More redness, swelling, or pain. More fluid or blood. Warmth. Pus or a bad smell. Do not pick at your toenail or try to remove it yourself. Soak your foot in warm, soapy water. Do this for 20 minutes, 3 times a day, or as often as told by your health care provider. This helps to keep your toe clean and your skin soft. Wear shoes that fit well and are not too tight. Your health care provider may recommend that you wear open-toed shoes while you heal. Trim your toenails regularly and carefully. Cut your toenails straight across to prevent injury to the skin at the corners of the toenail. Do not cut your nails in a curved shape. Keep your feet clean and dry to help prevent infection. General instructions Take over-the-counter and prescription medicines only as told by your health care provider. If you were prescribed an antibiotic, take it as told by your health care provider. Do not stop taking the antibiotic even if you start to feel better. If your health care provider told you to use crutches to help you move around, use them as instructed. Return to your normal activities as told by your health care provider. Ask your health care provider what activities are safe for you.  Keep all follow-up visits. This is important. Contact a health care provider if: You have more redness, swelling, pain, or other symptoms that do not improve with treatment. You have fluid, blood, or pus coming from your toenail. You have a red streak on your skin that starts at your foot and spreads up your leg. You have a fever. Summary An ingrown toenail occurs when the corner or sides of a toenail grow into the surrounding skin.  This causes discomfort and pain. The big toe is most commonly affected, but any of the toes can be affected. If an ingrown toenail is not treated, it can become infected. Fluid or pus draining from your toenail is a sign of infection. Your health care provider may need to drain it. You may be given antibiotics to treat the infection. Trimming your toenails regularly and properly can help you prevent an ingrown toenail. This information is not intended to replace advice given to you by your health care provider. Make sure you discuss any questions you have with your health care provider. Document Revised: 07/10/2020 Document Reviewed: 07/10/2020 Elsevier Patient Education  2024 [employer].

## 2023-08-13 NOTE — Progress Notes (Unsigned)
 Subjective:   Patient ID: Anthonette Bastos, male   DOB: 58 y.o.   MRN: [id number]   HPI Chief Complaint  Patient presents with   Ingrown Toenail    RM#11 Bilateral first and second toes ingrown.   58 year old male presents the office today for concerns of ingrown toenails both of his big toes as well as the second toe medial aspect.  He states the areas been ongoing for some time has been more of a chronic issue.  Currently denies any swelling redness or any drainage.  No open lesions.   Review of Systems  All other systems reviewed and are negative.  No past medical history on file.  No past surgical history on file.  No current outpatient medications on file.  No Known Allergies        Objective:  Physical Exam  General: AAO x3, NAD  Dermatological: Incurvation present to bilateral hallux toenails as well as second digit toenail.  There is mild edema with is no erythema, drainage or pus or any obvious signs of infection noted today.  There are no open lesions noted.  Vascular: Dorsalis Pedis artery and Posterior Tibial artery pedal pulses are 2/4 bilateral with immedate capillary fill time.  There is no pain with calf compression, swelling, warmth, erythema.   Neruologic: Grossly intact via light touch bilateral.   Musculoskeletal: Subjectively gets tenderness palpation of the ingrown toenails.  No other areas of discomfort.  Gait: Unassisted, Nonantalgic.       Assessment:   Toenails     Plan:  -Treatment options discussed including all alternatives, risks, and complications -Etiology of symptoms were discussed - Today we discussed partial nail avulsion with chemical matricectomy.  We discussed the procedure as well as postoperative course.  He is to go to work today and would like to reschedule this when he can take some time off.  For any signs or symptoms of infection or any worsening discomfort.  I will schedule him for partial nail avulsions with chemical  matricectomy at his convenience.  Return for ingrown toenail removal on a Friday (3 nails).  Charity Conch DPM

## 2023-09-04 ENCOUNTER — Ambulatory Visit (INDEPENDENT_AMBULATORY_CARE_PROVIDER_SITE_OTHER): Admitting: Podiatry

## 2023-09-04 DIAGNOSIS — L600 Ingrowing nail: Secondary | ICD-10-CM

## 2023-09-04 MED ORDER — CEPHALEXIN 500 MG PO CAPS: 500.0000 mg | ORAL_CAPSULE | Freq: Three times a day (TID) | ORAL | 0 refills | Status: AC

## 2023-09-04 NOTE — Patient Instructions (Signed)

## 2023-09-05 NOTE — Progress Notes (Signed)
 Subjective: Chief Complaint  Patient presents with   Ingrown Toenail    Rm  14 Patient is here for ingrown toe nail removal of the left hallux, left index and right hallux toe nail.   58 year old male presents the office today for concerns of ingrown tenderness to the medial aspect of bilateral hallux as well as left second digit toenail that he wants to have removed today.  The areas been tender.  No swelling, redness or any drainage.  Objective: AAO x3, NAD DP/PT pulses palpable bilaterally, CRT less than 3 seconds Incurvation present at the medial aspect of bilateral hallux toenails as well as left second digit toenail with tenderness to palpation.  There is minimal edema around the corners there is no erythema, warmth there is no cellulitis.  No drainage or pus.  He has a mildly hypertrophic, dystrophic and discolored. No open lesions or pre-ulcerative lesions.  No pain with calf compression, swelling, warmth, erythema  Assessment: Ingrown toenail  Plan: -All treatment options discussed with the patient including all alternatives, risks, complications.  -At this time, the patient is requesting partial nail removal with chemical matricectomy to the symptomatic portion of the nail. Risks and complications were discussed with the patient for which they understand and written consent was obtained. Under sterile conditions a total of 3 mL of a mixture of 2% lidocaine plain and 0.5% Marcaine plain was infiltrated in a digital block fashion. Once anesthetized, the skin was prepped in sterile fashion. A tourniquet was then applied. Next the medial aspect of bilateral hallux  and left 2nd nail border was then sharply excised making sure to remove the entire offending nail border. Once the nails were ensured to be removed area was debrided and the underlying skin was intact. There is no purulence identified in the procedure. Next phenol was then applied under standard conditions and copiously  irrigated. Silvadene was applied. A dry sterile dressing was applied. After application of the dressing the tourniquet was removed and there is found to be an immediate capillary refill time to the digit. The patient tolerated the procedure well any complications. Post procedure instructions were discussed the patient for which he verbally understood. Discussed signs/symptoms of infection and directed to call the office immediately should any occur or go directly to the emergency room. In the meantime, encouraged to call the office with any questions, concerns, changes symptoms. -Keflex -Patient encouraged to call the office with any questions, concerns, change in symptoms.   Return in about 2 years (around 09/03/2025), or if symptoms worsen or fail to improve, for nail check .  Charity Conch DPM
